# Patient Record
Sex: Female | Born: 2000 | Race: White | Hispanic: No | Marital: Single | Smoking: Never smoker
Health system: Southern US, Community
[De-identification: ages and names within clinical notes are randomized; demographics above are authoritative.]

## PROBLEM LIST (undated history)

## (undated) DIAGNOSIS — F909 Attention-deficit hyperactivity disorder, unspecified type: Secondary | ICD-10-CM

## (undated) HISTORY — PX: CHOLECYSTECTOMY: SHX55

## (undated) HISTORY — PX: TONSILLECTOMY AND ADENOIDECTOMY: SUR1326

---
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
## 2022-10-07 ENCOUNTER — Emergency Department (HOSPITAL_BASED_OUTPATIENT_CLINIC_OR_DEPARTMENT_OTHER): Payer: Medicaid Other | Admitting: Radiology

## 2022-10-07 ENCOUNTER — Emergency Department (HOSPITAL_BASED_OUTPATIENT_CLINIC_OR_DEPARTMENT_OTHER)
Admission: EM | Admit: 2022-10-07 | Discharge: 2022-10-07 | Disposition: A | Discharge status: Home / Self Care | Payer: Medicaid Other | Attending: Emergency Medicine | Admitting: Emergency Medicine

## 2022-10-07 ENCOUNTER — Other Ambulatory Visit: Payer: Self-pay

## 2022-10-07 ENCOUNTER — Encounter (HOSPITAL_BASED_OUTPATIENT_CLINIC_OR_DEPARTMENT_OTHER): Payer: Self-pay | Admitting: Emergency Medicine

## 2022-10-07 DIAGNOSIS — M79672 Pain in left foot: Secondary | ICD-10-CM | POA: Insufficient documentation
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
## 2022-11-01 ENCOUNTER — Telehealth: Payer: Self-pay | Admitting: Family Medicine

## 2022-11-01 DIAGNOSIS — R6889 Other general symptoms and signs: Secondary | ICD-10-CM

## 2022-11-01 NOTE — Progress Notes (Signed)
Connie Shepherd   Needs a extended work note from 12/15 to return to work- however we never saw her- and policy is to not back date or do extended notes.  She is encouraged to reach out to her PCP for follow up needs.

## 2023-06-03 ENCOUNTER — Telehealth: Payer: BC Managed Care – PPO | Admitting: Physician Assistant

## 2023-06-03 DIAGNOSIS — U071 COVID-19: Secondary | ICD-10-CM

## 2023-06-03 NOTE — Patient Instructions (Signed)
Connie Shepherd, thank you for joining Margaretann Loveless, PA-C for today's virtual visit.  While this provider is not your primary care provider (PCP), if your PCP is located in our provider database this encounter information will be shared with them immediately following your visit.   A [facility] MyChart account gives you access to today's visit and all your visits, tests, and labs performed at [facility] " click here if you don't have a [facility] MyChart account or go to [email or web address]  Consent: (Patient) Connie Shepherd provided verbal consent for this virtual visit at the beginning of the encounter.  Current Medications:  Current Outpatient Medications:    magic mouthwash SOLN, Take 5 mLs by mouth 3 (three) times daily., Disp: 60 mL, Rfl: 0   Medications ordered in this encounter:  No orders of the defined types were placed in this encounter.    *If you need refills on other medications prior to your next appointment, please contact your pharmacy*  Follow-Up: Call back or seek an in-person evaluation if the symptoms worsen or if the condition fails to improve as anticipated.  [facility] [phone]   Isolation Instructions: You are to isolate at home until you have been fever free for at least 24 hours without a fever-reducing medication, and symptoms have been steadily improving for 24 hours. At that time,  you can end isolation but need to mask for an additional 5 days.   If you must be around other household members who do not have symptoms, you need to make sure that both you and the family members are masking consistently with a high-quality mask.  If you note any worsening of symptoms despite treatment, please seek an in-person evaluation ASAP. If you note any significant shortness of breath or any chest pain, please seek ER evaluation. Please do not delay care!   COVID-19: What to Do if You Are Sick If you test positive and are  an older adult or someone who is at high risk of getting very sick from COVID-19, treatment may be available. Contact a healthcare provider right away after a positive test to determine if you are eligible, even if your symptoms are mild right now. You can also visit a Test to Treat location and, if eligible, receive a prescription from a provider. Don't delay: Treatment must be started within the first few days to be effective. If you have a fever, cough, or other symptoms, you might have COVID-19. Most people have mild illness and are able to recover at home. If you are sick: Keep track of your symptoms. If you have an emergency warning sign (including trouble breathing), call 911. Steps to help prevent the spread of COVID-19 if you are sick If you are sick with COVID-19 or think you might have COVID-19, follow the steps below to care for yourself and to help protect other people in your home and community. Stay home except to get medical care Stay home. Most people with COVID-19 have mild illness and can recover at home without medical care. Do not leave your home, except to get medical care. Do not visit public areas and do not go to places where you are unable to wear a mask. Take care of yourself. Get rest and stay hydrated. Take over-the-counter medicines, such as acetaminophen, to help you feel better. Stay in touch with your doctor. Call before you get medical care. Be sure to get care if you have trouble breathing, or  have any other emergency warning signs, or if you think it is an emergency. Avoid public transportation, ride-sharing, or taxis if possible. Get tested If you have symptoms of COVID-19, get tested. While waiting for test results, stay away from others, including staying apart from those living in your household. Get tested as soon as possible after your symptoms start. Treatments may be available for people with COVID-19 who are at risk for becoming very sick. Don't delay:  Treatment must be started early to be effective--some treatments must begin within 5 days of your first symptoms. Contact your healthcare provider right away if your test result is positive to determine if you are eligible. Self-tests are one of several options for testing for the virus that causes COVID-19 and may be more convenient than laboratory-based tests and point-of-care tests. Ask your healthcare provider or your local health department if you need help interpreting your test results. You can visit your state, tribal, local, and territorial health department's website to look for the latest local information on testing sites. Separate yourself from other people As much as possible, stay in a specific room and away from other people and pets in your home. If possible, you should use a separate bathroom. If you need to be around other people or animals in or outside of the home, wear a well-fitting mask. Tell your close contacts that they may have been exposed to COVID-19. An infected person can spread COVID-19 starting 48 hours (or 2 days) before the person has any symptoms or tests positive. By letting your close contacts know they may have been exposed to COVID-19, you are helping to protect everyone. See COVID-19 and Animals if you have questions about pets. If you are diagnosed with COVID-19, someone from the health department may call you. Answer the call to slow the spread. Monitor your symptoms Symptoms of COVID-19 include fever, cough, or other symptoms. Follow care instructions from your healthcare provider and local health department. Your local health authorities may give instructions on checking your symptoms and reporting information. When to seek emergency medical attention Look for emergency warning signs* for COVID-19. If someone is showing any of these signs, seek emergency medical care immediately: Trouble breathing Persistent pain or pressure in the chest New  confusion Inability to wake or stay awake Pale, gray, or blue-colored skin, lips, or nail beds, depending on skin tone *This list is not all possible symptoms. Please call your medical provider for any other symptoms that are severe or concerning to you. Call 911 or call ahead to your local emergency facility: Notify the operator that you are seeking care for someone who has or may have COVID-19. Call ahead before visiting your doctor Call ahead. Many medical visits for routine care are being postponed or done by phone or telemedicine. If you have a medical appointment that cannot be postponed, call your doctor's office, and tell them you have or may have COVID-19. This will help the office protect themselves and other patients. If you are sick, wear a well-fitting mask You should wear a mask if you must be around other people or animals, including pets (even at home). Wear a mask with the best fit, protection, and comfort for you. You don't need to wear the mask if you are alone. If you can't put on a mask (because of trouble breathing, for example), cover your coughs and sneezes in some other way. Try to stay at least 6 feet away from other people. This will help protect  the people around you. Masks should not be placed on young children under age 88 years, anyone who has trouble breathing, or anyone who is not able to remove the mask without help. Cover your coughs and sneezes Cover your mouth and nose with a tissue when you cough or sneeze. Throw away used tissues in a lined trash can. Immediately wash your hands with soap and water for at least 20 seconds. If soap and water are not available, clean your hands with an alcohol-based hand sanitizer that contains at least 60% alcohol. Clean your hands often Wash your hands often with soap and water for at least 20 seconds. This is especially important after blowing your nose, coughing, or sneezing; going to the bathroom; and before eating or  preparing food. Use hand sanitizer if soap and water are not available. Use an alcohol-based hand sanitizer with at least 60% alcohol, covering all surfaces of your hands and rubbing them together until they feel dry. Soap and water are the best option, especially if hands are visibly dirty. Avoid touching your eyes, nose, and mouth with unwashed hands. Handwashing Tips Avoid sharing personal household items Do not share dishes, drinking glasses, cups, eating utensils, towels, or bedding with other people in your home. Wash these items thoroughly after using them with soap and water or put in the dishwasher. Clean surfaces in your home regularly Clean and disinfect high-touch surfaces (for example, doorknobs, tables, handles, light switches, and countertops) in your "sick room" and bathroom. In shared spaces, you should clean and disinfect surfaces and items after each use by the person who is ill. If you are sick and cannot clean, a caregiver or other person should only clean and disinfect the area around you (such as your bedroom and bathroom) on an as needed basis. Your caregiver/other person should wait as long as possible (at least several hours) and wear a mask before entering, cleaning, and disinfecting shared spaces that you use. Clean and disinfect areas that may have blood, stool, or body fluids on them. Use household cleaners and disinfectants. Clean visible dirty surfaces with household cleaners containing soap or detergent. Then, use a household disinfectant. Use a product from [employer] List N: Disinfectants for Coronavirus (COVID-19). Be sure to follow the instructions on the label to ensure safe and effective use of the product. Many products recommend keeping the surface wet with a disinfectant for a certain period of time (look at "contact time" on the product label). You may also need to wear personal protective equipment, such as gloves, depending on the directions on the product  label. Immediately after disinfecting, wash your hands with soap and water for 20 seconds. For completed guidance on cleaning and disinfecting your home, visit Complete Disinfection Guidance. Take steps to improve ventilation at home Improve ventilation (air flow) at home to help prevent from spreading COVID-19 to other people in your household. Clear out COVID-19 virus particles in the air by opening windows, using air filters, and turning on fans in your home. Use this interactive tool to learn how to improve air flow in your home. When you can be around others after being sick with COVID-19 Deciding when you can be around others is different for different situations. Find out when you can safely end home isolation. For any additional questions about your care, contact your healthcare provider or state or local health department. 01/26/2021 Content source: [facility] for Immunization and Respiratory Diseases ([facility]), Division of Viral Diseases This information is not intended  to replace advice given to you by your health care provider. Make sure you discuss any questions you have with your health care provider. Document Revised: 03/11/2021 Document Reviewed: 03/11/2021 Elsevier Patient Education  2022 [employer].     If you have been instructed to have an in-person evaluation today at a local Urgent Care facility, please use the link below. It will take you to a list of all of our available [facility], including address, phone number and hours of operation. Please do not delay care.  [facility]  If you or a family member do not have a primary care provider, use the link below to schedule a visit and establish care. When you choose a [facility] primary care physician or advanced practice provider, you gain a long-term partner in health. Find a Primary Care Provider  Learn more about [facility]'s in-office and virtual care options: [facility] - Get  Care Now

## 2023-06-03 NOTE — Progress Notes (Signed)
Virtual Visit Consent   Connie Shepherd, you are scheduled for a virtual visit with a [facility] provider today. Just as with appointments in the office, your consent must be obtained to participate. Your consent will be active for this visit and any virtual visit you may have with one of our providers in the next 365 days. If you have a MyChart account, a copy of this consent can be sent to you electronically.  As this is a virtual visit, video technology does not allow for your provider to perform a traditional examination. This may limit your provider's ability to fully assess your condition. If your provider identifies any concerns that need to be evaluated in person or the need to arrange testing (such as labs, EKG, etc.), we will make arrangements to do so. Although advances in technology are sophisticated, we cannot ensure that it will always work on either your end or our end. If the connection with a video visit is poor, the visit may have to be switched to a telephone visit. With either a video or telephone visit, we are not always able to ensure that we have a secure connection.  By engaging in this virtual visit, you consent to the provision of healthcare and authorize for your insurance to be billed (if applicable) for the services provided during this visit. Depending on your insurance coverage, you may receive a charge related to this service.  I need to obtain your verbal consent now. Are you willing to proceed with your visit today? Connie Shepherd has provided verbal consent on 06/03/2023 for a virtual visit (video or telephone). Margaretann Loveless, PA-C  Date: 06/03/2023 1:19 PM  Virtual Visit via Video Note   I, Margaretann Loveless, connected with  Connie Shepherd  ([id number], 04/01/2001) on 06/03/23 at  1:15 PM EDT by a video-enabled telemedicine application and verified that I am speaking with the correct person using two identifiers.  Location: Patient: Virtual Visit Location  Patient: Home Provider: Virtual Visit Location Provider: Home Office   I discussed the limitations of evaluation and management by telemedicine and the availability of in person appointments. The patient expressed understanding and agreed to proceed.    History of Present Illness: Connie Shepherd is a 22 y.o. who identifies as a female who was assigned female at birth, and is being seen today for Covid 66. Tested positive on Monday, 05/29/23. Last fever was Thursday, 06/01/23. She is now symptom-free. She needs a work note stating she can return on Monday without restrictions.    Problems: There are no problems to display for this patient.   Allergies: No Known Allergies Medications:  Current Outpatient Medications:    magic mouthwash SOLN, Take 5 mLs by mouth 3 (three) times daily., Disp: 60 mL, Rfl: 0  Observations/Objective: Patient is well-developed, well-nourished in no acute distress.  Resting comfortably at home.  Head is normocephalic, atraumatic.  No labored breathing.  Speech is clear and coherent with logical content.  Patient is alert and oriented at baseline.    Assessment and Plan: 1. COVID-19  - Symptoms improved without issue - Work note to return without restrictions provided  Follow Up Instructions: I discussed the assessment and treatment plan with the patient. The patient was provided an opportunity to ask questions and all were answered. The patient agreed with the plan and demonstrated an understanding of the instructions.  A copy of instructions were sent to the patient via MyChart unless otherwise noted below.  The patient was advised to call back or seek an in-person evaluation if the symptoms worsen or if the condition fails to improve as anticipated.  Time:  I spent 10 minutes with the patient via telehealth technology discussing the above problems/concerns.    Margaretann Loveless, PA-C

## 2023-10-17 ENCOUNTER — Other Ambulatory Visit: Payer: Self-pay

## 2023-10-17 ENCOUNTER — Emergency Department (HOSPITAL_BASED_OUTPATIENT_CLINIC_OR_DEPARTMENT_OTHER): Payer: Self-pay | Admitting: Radiology

## 2023-10-17 ENCOUNTER — Encounter (HOSPITAL_BASED_OUTPATIENT_CLINIC_OR_DEPARTMENT_OTHER): Payer: Self-pay

## 2023-10-17 ENCOUNTER — Emergency Department (HOSPITAL_BASED_OUTPATIENT_CLINIC_OR_DEPARTMENT_OTHER)
Admission: EM | Admit: 2023-10-17 | Discharge: 2023-10-17 | Disposition: A | Discharge status: Home / Self Care | Payer: Self-pay | Attending: Emergency Medicine | Admitting: Emergency Medicine

## 2023-10-17 DIAGNOSIS — J181 Lobar pneumonia, unspecified organism: Secondary | ICD-10-CM | POA: Insufficient documentation

## 2023-10-17 DIAGNOSIS — J189 Pneumonia, unspecified organism: Secondary | ICD-10-CM

## 2023-10-17 DIAGNOSIS — Z1152 Encounter for screening for COVID-19: Secondary | ICD-10-CM | POA: Insufficient documentation

## 2023-10-17 LAB — RESP PANEL BY RT-PCR (RSV, FLU A&B, COVID)  RVPGX2
Influenza A by PCR: NEGATIVE
Influenza B by PCR: NEGATIVE
Resp Syncytial Virus by PCR: NEGATIVE
SARS Coronavirus 2 by RT PCR: NEGATIVE

## 2023-10-17 MED ORDER — DOXYCYCLINE HYCLATE 100 MG PO CAPS: 100.0000 mg | ORAL_CAPSULE | Freq: Two times a day (BID) | ORAL | 0 refills | Status: AC

## 2023-10-17 NOTE — Discharge Instructions (Addendum)
Thank you for allowing [place] to be a part of your care today.  Your workup is consistent with a right lower lobe pneumonia. You tested negative for Covid, influenza, and RSV.   I am sending you home on 5 days of doxycycline to treat your pneumonia.  Take for the entirety of the prescription, even if your symptoms begin to improve.  Stay sitting upright for at least 30 minutes after taking doxycycline to avoid irritation to the esophagus.   Follow up with a primary care doctor after completing your antibiotics to ensure you have improved.   You may take Delsym or Robitussin (active ingredient dextromethorphan) to help with cough symptoms, especially at night time.  Return to the ED if you develop sudden worsening of your symptoms or if you have new concerns.

## 2023-10-17 NOTE — ED Provider Notes (Signed)
[facility] EMERGENCY DEPARTMENT AT [facility] Provider Note   CSN: [id number] Arrival date & time: 10/17/23  8295     History  Chief Complaint  Patient presents with   Fever   Cough    Connie Shepherd is a 22 y.o. female with past medical history significant for ADHD presents to the ED complaining of fever and cough since last Wednesday night.  She states her fever has been waxing and waning with the highest temp being 102.32F.  She had nausea and vomiting over the weekend, but has been able to eat and drink without difficulty the past few days.  She states she mainly vomited whenever she had a high fever.  Patient reports her high fevers have also resolved and now her temp is around 100F at its highest.  She has more discomfort and coughing when she lies on her right side.  Patient works in childcare.  Denies sore throat, otalgia, congestion, shortness of breath, wheezing, abdominal pain.  Denies history of asthma or other breathing problems.        Home Medications Prior to Admission medications   Medication Sig Start Date End Date Taking? Authorizing Provider  magic mouthwash SOLN Take 5 mLs by mouth 3 (three) times daily. 09/23/20   Frederica Kuster, MD      Allergies    Patient has no known allergies.    Review of Systems   Review of Systems  Constitutional:  Positive for fever.  HENT:  Negative for congestion, ear pain and sore throat.   Respiratory:  Positive for cough. Negative for shortness of breath and wheezing.   Gastrointestinal:  Positive for nausea and vomiting. Negative for abdominal pain.    Physical Exam Updated Vital Signs BP 124/83 (BP Location: Right Arm)   Pulse (!) 108   Temp 98.2 F (36.8 C) (Oral)   Resp 18   Ht 5\' 6"  (1.676 m)   Wt 113.4 kg   LMP 10/14/2023 (Exact Date)   SpO2 99%   BMI 40.35 kg/m  Physical Exam Vitals and nursing note reviewed.  Constitutional:      General: She is not in acute distress.    Appearance: Normal  appearance. She is not ill-appearing or diaphoretic.  Cardiovascular:     Rate and Rhythm: Normal rate and regular rhythm.  Pulmonary:     Effort: Pulmonary effort is normal. No tachypnea or respiratory distress.     Breath sounds: Normal air entry. Examination of the right-lower field reveals rales. Rales present. No decreased breath sounds.  Abdominal:     General: Abdomen is flat.     Palpations: Abdomen is soft.     Tenderness: There is no abdominal tenderness.  Skin:    General: Skin is warm and dry.     Capillary Refill: Capillary refill takes less than 2 seconds.  Neurological:     Mental Status: She is alert. Mental status is at baseline.  Psychiatric:        Mood and Affect: Mood normal.        Behavior: Behavior normal.     ED Results / Procedures / Treatments   Labs (all labs ordered are listed, but only abnormal results are displayed) Labs Reviewed  RESP PANEL BY RT-PCR (RSV, FLU A&B, COVID)  [id number]    EKG None  Radiology DG Chest 2 View  Result Date: 10/17/2023 CLINICAL DATA:  Cough and fever for 6 days. EXAM: CHEST - 2 VIEW COMPARISON:  11/28/2004. FINDINGS: There  are heterogeneous opacities in the right lung lower lobe, without volume loss, compatible with pneumonia. Follow-up to clearing is recommended. Bilateral lung fields are otherwise clear. Bilateral costophrenic angles are clear. Normal cardio-mediastinal silhouette. No acute osseous abnormalities. The soft tissues are within normal limits. IMPRESSION: *Right lung lower lobe pneumonia. Follow-up to clearing is recommended. Electronically Signed   By: Jules Schick M.D.   On: 10/17/2023 10:44    Procedures Procedures    Medications Ordered in ED Medications - No data to display  ED Course/ Medical Decision Making/ A&P                                 Medical Decision Making Amount and/or Complexity of Data Reviewed Radiology: ordered.  Risk Prescription drug management.   This patient  presents to the ED with chief complaint(s) of cough, fever.  The complaint involves an extensive differential diagnosis and also carries with it a high risk of complications and morbidity.    The differential diagnosis includes viral syndrome, pneumonia   The initial plan is to obtain resp panel, chest x-ray  Initial Assessment:   On exam, patient is sitting up in bed and does not appear to be in acute respiratory distress.  She is speaking in full sentences.  Patient is mildly tachycardic with a HR of 100-106.  Regular rhythm.  Rales appreciated in the lower right lung field.  All other lung fields unremarkable.  Adequate tidal volume.  Abdomen is soft and non tender to palpation.  Bilateral TMs with serous fluid, but no evidence of infection.  EACs unremarkable.   Independent ECG/labs interpretation:  The following labs were independently interpreted:  Negative for Covid, influenza, and RSV.   Independent visualization and interpretation of imaging: I independently visualized the following imaging with scope of interpretation limited to determining acute life threatening conditions related to emergency care: chest x-ray, which revealed probable pneumonia in the right lower lobe.   Disposition:   Patient's workup consistent with pneumonia.  Will treat with antibiotics.  Advised patient to follow up with primary care provider after completion of antibiotics.  Work note provided.   The patient has been appropriately medically screened and/or stabilized in the ED. I have low suspicion for any other emergent medical condition which would require further screening, evaluation or treatment in the ED or require inpatient management. At time of discharge the patient is hemodynamically stable and in no acute distress. I have discussed work-up results and diagnosis with patient and answered all questions. Patient is agreeable with discharge plan. We discussed strict return precautions for returning to the  emergency department and they verbalized understanding.             Final Clinical Impression(s) / ED Diagnoses Final diagnoses:  Pneumonia of right lower lobe due to infectious organism    Rx / DC Orders ED Discharge Orders     None         Lenard Simmer, PA-C 10/17/23 1118    Melene Plan, DO 10/17/23 1122

## 2023-10-17 NOTE — ED Triage Notes (Signed)
Pt POV from home c/o ongoing fever w/ cough since last Wednesday night. Also c/o N/V x 2 days, but denies current N/V. Pt NAD during triage

## 2024-09-23 ENCOUNTER — Emergency Department (HOSPITAL_BASED_OUTPATIENT_CLINIC_OR_DEPARTMENT_OTHER): Payer: Self-pay | Admitting: Radiology

## 2024-09-23 ENCOUNTER — Emergency Department (HOSPITAL_BASED_OUTPATIENT_CLINIC_OR_DEPARTMENT_OTHER)
Admission: EM | Admit: 2024-09-23 | Discharge: 2024-09-23 | Disposition: A | Discharge status: Home / Self Care | Payer: Self-pay | Attending: Emergency Medicine | Admitting: Emergency Medicine

## 2024-09-23 ENCOUNTER — Other Ambulatory Visit: Payer: Self-pay

## 2024-09-23 DIAGNOSIS — J329 Chronic sinusitis, unspecified: Secondary | ICD-10-CM | POA: Insufficient documentation

## 2024-09-23 DIAGNOSIS — R112 Nausea with vomiting, unspecified: Secondary | ICD-10-CM | POA: Insufficient documentation

## 2024-09-23 DIAGNOSIS — J3489 Other specified disorders of nose and nasal sinuses: Secondary | ICD-10-CM | POA: Insufficient documentation

## 2024-09-23 LAB — URINALYSIS, ROUTINE W REFLEX MICROSCOPIC
Bacteria, UA: NONE SEEN
Bilirubin Urine: NEGATIVE
Glucose, UA: NEGATIVE mg/dL
Ketones, ur: 15 mg/dL — AB
Nitrite: NEGATIVE
Specific Gravity, Urine: 1.028 (ref 1.005–1.030)
pH: 6 (ref 5.0–8.0)

## 2024-09-23 LAB — COMPREHENSIVE METABOLIC PANEL WITH GFR
ALT: 11 U/L (ref 0–44)
AST: 15 U/L (ref 15–41)
Albumin: 4.8 g/dL (ref 3.5–5.0)
Alkaline Phosphatase: 110 U/L (ref 38–126)
Anion gap: 13 (ref 5–15)
BUN: 8 mg/dL (ref 6–20)
CO2: 25 mmol/L (ref 22–32)
Calcium: 10.1 mg/dL (ref 8.9–10.3)
Chloride: 105 mmol/L (ref 98–111)
Creatinine, Ser: 0.76 mg/dL (ref 0.44–1.00)
GFR, Estimated: >60 mL/min (ref >60)
Glucose, Bld: 112 mg/dL — ABNORMAL HIGH (ref 70–99)
Potassium: 3.8 mmol/L (ref 3.5–5.1)
Sodium: 143 mmol/L (ref 135–145)
Total Bilirubin: 0.4 mg/dL (ref 0.0–1.2)
Total Protein: 8 g/dL (ref 6.5–8.1)

## 2024-09-23 LAB — RESP PANEL BY RT-PCR (RSV, FLU A&B, COVID)  RVPGX2
Influenza A by PCR: NEGATIVE
Influenza B by PCR: NEGATIVE
Resp Syncytial Virus by PCR: NEGATIVE
SARS Coronavirus 2 by RT PCR: NEGATIVE

## 2024-09-23 LAB — PREGNANCY, URINE: Preg Test, Ur: NEGATIVE

## 2024-09-23 LAB — CBC WITH DIFFERENTIAL/PLATELET
Abs Immature Granulocytes: 0.09 K/uL — ABNORMAL HIGH (ref 0.00–0.07)
Basophils Absolute: 0.1 K/uL (ref 0.0–0.1)
Basophils Relative: 0 %
Eosinophils Absolute: 0.1 K/uL (ref 0.0–0.5)
Eosinophils Relative: 1 %
HCT: 41 % (ref 36.0–46.0)
Hemoglobin: 13.6 g/dL (ref 12.0–15.0)
Immature Granulocytes: 1 %
Lymphocytes Relative: 13 %
Lymphs Abs: 2.2 K/uL (ref 0.7–4.0)
MCH: 28.9 pg (ref 26.0–34.0)
MCHC: 33.2 g/dL (ref 30.0–36.0)
MCV: 87.2 fL (ref 80.0–100.0)
Monocytes Absolute: 0.8 K/uL (ref 0.1–1.0)
Monocytes Relative: 5 %
Neutro Abs: 13.7 K/uL — ABNORMAL HIGH (ref 1.7–7.7)
Neutrophils Relative %: 80 %
Platelets: 411 K/uL — ABNORMAL HIGH (ref 150–400)
RBC: 4.7 MIL/uL (ref 3.87–5.11)
RDW: 13.2 % (ref 11.5–15.5)
WBC: 16.9 K/uL — ABNORMAL HIGH (ref 4.0–10.5)
nRBC: 0 % (ref 0.0–0.2)

## 2024-09-23 LAB — GROUP A STREP BY PCR: Group A Strep by PCR: NOT DETECTED

## 2024-09-23 MED ORDER — ONDANSETRON HCL 4 MG/2ML IJ SOLN
4.0000 mg | Freq: Once | INTRAMUSCULAR | Status: AC
  Administered 2024-09-23: 4 mg via INTRAVENOUS
  Filled 2024-09-23: qty 2

## 2024-09-23 MED ORDER — ONDANSETRON 4 MG PO TBDP: 4.0000 mg | ORAL_TABLET | Freq: Three times a day (TID) | ORAL | 0 refills | Status: DC | PRN

## 2024-09-23 MED ORDER — AMOXICILLIN-POT CLAVULANATE 875-125 MG PO TABS: 1.0000 | ORAL_TABLET | Freq: Two times a day (BID) | ORAL | 0 refills | Status: DC

## 2024-09-23 MED ORDER — AMOXICILLIN-POT CLAVULANATE 875-125 MG PO TABS
1.0000 | ORAL_TABLET | Freq: Once | ORAL | Status: AC
  Administered 2024-09-23: 1 via ORAL
  Filled 2024-09-23: qty 1

## 2024-09-23 NOTE — Discharge Instructions (Addendum)
 Today you were seen for a upper respiratory infection and nausea and vomiting.  You have been prescribed Augmentin for what I suspect is likely bacterial sinusitis.  You have also been prescribed Zofran  for nausea and vomiting.  You may alternate Tylenol  Motrin as needed for pain.  Thank you for letting us  treat you today. After reviewing your labs and imaging, I feel you are safe to go home. Please follow up with your PCP in the next several days and provide them with your records from this visit. Return to the Emergency Room if pain becomes severe or symptoms worsen.

## 2024-09-23 NOTE — ED Notes (Signed)
 Pt states provider told her to eat crackers with her antibiotic. Pt asking for crackers. Pt given crackers

## 2024-09-23 NOTE — ED Triage Notes (Signed)
 Reports n/v starting last night. States had cold like symptoms x 2 weeks. Denies fever.

## 2024-09-23 NOTE — ED Notes (Signed)
Given ginger ail for PO challenge

## 2024-09-23 NOTE — ED Provider Notes (Signed)
 [facility] Provider Note   CSN: [id number] Arrival date & time: 09/23/24  1559     Patient presents with: Emesis   Connie Shepherd is a 23 y.o. female.  Presents today for cough, congestion, and runny nose times approximately 2 weeks.  Patient also reports nausea and vomiting that began last night.  Patient denies chest pain, shortness of breath, fever, chills, or abdominal pain.    Emesis Associated symptoms: cough        Prior to Admission medications   Medication Sig Start Date End Date Taking? Authorizing Provider  amoxicillin-clavulanate (AUGMENTIN) 875-125 MG tablet Take 1 tablet by mouth every 12 (twelve) hours. 09/23/24  Yes Francis Ileana SAILOR, PA-C  ondansetron  (ZOFRAN -ODT) 4 MG disintegrating tablet Take 1 tablet (4 mg total) by mouth every 8 (eight) hours as needed for nausea or vomiting. 09/23/24  Yes Francis Ileana SAILOR, PA-C  magic mouthwash SOLN Take 5 mLs by mouth 3 (three) times daily. 09/23/20   Cleotilde Garnette HERO, MD    Allergies: Patient has no known allergies.    Review of Systems  HENT:  Positive for congestion and rhinorrhea.   Respiratory:  Positive for cough.   Gastrointestinal:  Positive for nausea and vomiting.    Updated Vital Signs BP 130/60   Pulse 85   Temp 98.8 F (37.1 C)   Resp 15   SpO2 100%   Physical Exam Vitals and nursing note reviewed.  Constitutional:      General: She is not in acute distress.    Appearance: She is well-developed. She is not toxic-appearing or diaphoretic.  HENT:     Head: Normocephalic and atraumatic.     Right Ear: External ear normal.     Left Ear: External ear normal.     Nose: Congestion and rhinorrhea present.     Mouth/Throat:     Mouth: Mucous membranes are moist.     Pharynx: Oropharynx is clear.  Eyes:     Extraocular Movements: Extraocular movements intact.     Conjunctiva/sclera: Conjunctivae normal.  Cardiovascular:     Rate and Rhythm: Normal rate and  regular rhythm.     Pulses: Normal pulses.     Heart sounds: Normal heart sounds. No murmur heard. Pulmonary:     Effort: Pulmonary effort is normal. No respiratory distress.     Breath sounds: Normal breath sounds.  Abdominal:     General: There is no distension.     Palpations: Abdomen is soft.     Tenderness: There is no abdominal tenderness. There is no guarding.  Musculoskeletal:        General: No swelling.     Cervical back: Normal range of motion and neck supple.  Skin:    General: Skin is warm and dry.     Capillary Refill: Capillary refill takes less than 2 seconds.  Neurological:     General: No focal deficit present.     Mental Status: She is alert and oriented to person, place, and time.  Psychiatric:        Mood and Affect: Mood normal.     (all labs ordered are listed, but only abnormal results are displayed) Labs Reviewed  COMPREHENSIVE METABOLIC PANEL WITH GFR - Abnormal; Notable for the following components:      Result Value   Glucose, Bld 112 (*)    All other components within normal limits  CBC WITH DIFFERENTIAL/PLATELET - Abnormal; Notable for the following components:  WBC 16.9 (*)    Platelets 411 (*)    Neutro Abs 13.7 (*)    Abs Immature Granulocytes 0.09 (*)    All other components within normal limits  URINALYSIS, ROUTINE W REFLEX MICROSCOPIC - Abnormal; Notable for the following components:   Hgb urine dipstick MODERATE (*)    Ketones, ur 15 (*)    Protein, ur TRACE (*)    Leukocytes,Ua TRACE (*)    All other components within normal limits  GROUP A STREP BY PCR  RESP PANEL BY RT-PCR (RSV, FLU A&B, COVID)  RVPGX2  PREGNANCY, URINE    EKG: None  Radiology: DG Chest 2 View Result Date: 09/23/2024 CLINICAL DATA:  Cough, nausea, vomiting EXAM: CHEST - 2 VIEW COMPARISON:  Chest radiograph dated 10/17/2023 FINDINGS: Normal lung volumes. No focal consolidations. No pleural effusion or pneumothorax. The heart size and mediastinal contours are  within normal limits. No acute osseous abnormality. IMPRESSION: No active cardiopulmonary disease. Electronically Signed   By: Limin  Xu M.D.   On: 09/23/2024 17:48     Procedures   Medications Ordered in the ED  amoxicillin-clavulanate (AUGMENTIN) 875-125 MG per tablet 1 tablet (has no administration in time range)  ondansetron  (ZOFRAN ) injection 4 mg (4 mg Intravenous Given 09/23/24 1710)  ondansetron  (ZOFRAN ) injection 4 mg (4 mg Intravenous Given 09/23/24 2042)                                    Medical Decision Making Amount and/or Complexity of Data Reviewed Labs: ordered. Radiology: ordered.  Risk Prescription drug management.   This patient presents to the ED for concern of N/V, URI sx differential diagnosis includes COVID, flu, RSV, viral upper respiratory infection, pneumonia, choledocholithiasis, acute cholecystitis, appendicitis, viral GI illness, SBO, volvulus,    Additional history obtained   Additional history obtained from Electronic Medical Record External records from outside source obtained and reviewed including family medicine notes   Lab Tests:  I Ordered, and personally interpreted labs.  The pertinent results include: CMP unremarkable, leukocytosis at 16.9, elevated platelets at 411, UA with moderate hemoglobin, 15 ketones, trace leukocytes, trace protein, negative strep PCR, Resp panel negative   Imaging Studies ordered:  I ordered imaging studies including chest x-ray I independently visualized and interpreted imaging which showed no active cardiopulmonary disease I agree with the radiologist interpretation   Medicines ordered and prescription drug management:  I ordered medication including Zofran  and Augmentin I have reviewed the patients home medicines and have made adjustments as needed   Problem List / ED Course:  Patient tolerating p.o. intake without issue prior to discharge. Considered for admission further workup however  patient's vital signs, physical exam, labs, and imaging are reassuring.  Patient's symptoms likely due to acute bacterial sinusitis.  Patient given course of Augmentin and Zofran  for nausea and vomiting.  Patient given return precautions.  I feel patient is safe for discharge at this time.       Final diagnoses:  Sinusitis, unspecified chronicity, unspecified location  Nausea and vomiting, unspecified vomiting type    ED Discharge Orders          Ordered    ondansetron  (ZOFRAN -ODT) 4 MG disintegrating tablet  Every 8 hours PRN        09/23/24 2053    amoxicillin-clavulanate (AUGMENTIN) 875-125 MG tablet  Every 12 hours        09/23/24 2053  Francis Ileana SAILOR, PA-C 09/23/24 2101    Bernard Drivers, MD 09/23/24 2302

## 2024-10-26 ENCOUNTER — Encounter (HOSPITAL_COMMUNITY): Payer: Self-pay

## 2024-10-26 ENCOUNTER — Ambulatory Visit (HOSPITAL_COMMUNITY): Payer: Self-pay

## 2024-10-26 ENCOUNTER — Ambulatory Visit (HOSPITAL_COMMUNITY)
Admission: RE | Admit: 2024-10-26 | Discharge: 2024-10-26 | Disposition: A | Discharge status: Home / Self Care | Payer: Self-pay | Source: Ambulatory Visit | Attending: Emergency Medicine | Admitting: Emergency Medicine

## 2024-10-26 VITALS — BP 110/70 | HR 100 | Temp 97.9°F | Resp 17

## 2024-10-26 DIAGNOSIS — R051 Acute cough: Secondary | ICD-10-CM

## 2024-10-26 DIAGNOSIS — J209 Acute bronchitis, unspecified: Secondary | ICD-10-CM

## 2024-10-26 MED ORDER — ALBUTEROL SULFATE HFA 108 (90 BASE) MCG/ACT IN AERS: 1.0000 | INHALATION_SPRAY | Freq: Four times a day (QID) | RESPIRATORY_TRACT | 0 refills | Status: AC | PRN

## 2024-10-26 MED ORDER — PREDNISONE 20 MG PO TABS: 40.0000 mg | ORAL_TABLET | Freq: Every day | ORAL | 0 refills | Status: AC

## 2024-10-26 MED ORDER — PROMETHAZINE-DM 6.25-15 MG/5ML PO SYRP: 5.0000 mL | ORAL_SOLUTION | Freq: Every evening | ORAL | 0 refills | Status: AC | PRN

## 2024-10-26 MED ORDER — BENZONATATE 100 MG PO CAPS: 100.0000 mg | ORAL_CAPSULE | Freq: Three times a day (TID) | ORAL | 0 refills | Status: AC

## 2024-10-26 NOTE — Discharge Instructions (Signed)
 Your x-ray did not reveal any underlying pneumonia. I believe your symptoms are likely related to an ongoing bronchitis. Start taking 2 tablets of prednisone  once daily for 5 days to help with this. You can take Tessalon  every 8 hours as needed for cough. You can take Promethazine  DM cough syrup at bedtime as needed for cough.  This can make you drowsy so do not drive, work, drink alcohol, or take this with NyQuil or other sedating medications. I have also prescribed an albuterol  inhaler that you can use 1 to 2 puffs every 4-6 hours as needed for any shortness of breath or chest tightness.  This can also help with cough. Follow-up with your primary care provider or return here as needed.

## 2024-10-26 NOTE — ED Triage Notes (Signed)
 Pt reports she had cough since 12/5. Reports since last week had congestion and coughing up phlegm. Mucous is now greenish when blows her nose. Pt taking Dayquil and Nyquil. Reports when takes a deep breath it feels cold

## 2024-10-26 NOTE — ED Provider Notes (Signed)
 " [facility]    CSN: [id number] Arrival date & time: 10/26/24  9182      History   Chief Complaint Chief Complaint  Patient presents with   Cough    With back pain and congestion - Entered by patient    HPI Connie Shepherd is a 23 y.o. female.   Patient presents with progressively worsening cough since 12/5.  Patient states that she feels like she has had an increase in nasal congestion, chest congestion, and coughing up phlegm over the last week.  Patient states that at times she does have chest pain/tightness and shortness of breath with coughing.  Patient states that she has also felt a little nauseated over the last week or so.  Patient reports that her cough has been keeping her up at night.  Patient reports that she has been taking DayQuil and NyQuil with minimal relief.  Denies any history of asthma.  Denies any fever, body aches, chills, and weakness.  The history is provided by the patient and medical records.  Cough   Past Medical History:  Diagnosis Date   ADHD (attention deficit hyperactivity disorder)     There are no active problems to display for this patient.   Past Surgical History:  Procedure Laterality Date   CHOLECYSTECTOMY     TONSILLECTOMY AND ADENOIDECTOMY      OB History   No obstetric history on file.      Home Medications    Prior to Admission medications  Medication Sig Start Date End Date Taking? Authorizing Provider  albuterol  (VENTOLIN  HFA) 108 (90 Base) MCG/ACT inhaler Inhale 1-2 puffs into the lungs every 6 (six) hours as needed for wheezing or shortness of breath. 10/26/24  Yes Johnie, Quierra Silverio A, NP  benzonatate  (TESSALON ) 100 MG capsule Take 1 capsule (100 mg total) by mouth every 8 (eight) hours. 10/26/24  Yes Johnie, Moise Friday A, NP  predniSONE  (DELTASONE ) 20 MG tablet Take 2 tablets (40 mg total) by mouth daily for 5 days. 10/26/24 10/31/24 Yes Johnie Flaming A, NP  promethazine -dextromethorphan (PROMETHAZINE -DM)  6.25-15 MG/5ML syrup Take 5 mLs by mouth at bedtime as needed for cough. 10/26/24  Yes Johnie Flaming A, NP  magic mouthwash SOLN Take 5 mLs by mouth 3 (three) times daily. 09/23/20   Cleotilde Garnette HERO, MD    Family History Family History  Problem Relation Age of Onset   Healthy Mother     Social History Social History[1]   Allergies   Patient has no known allergies.   Review of Systems Review of Systems  Respiratory:  Positive for cough.    Per HPI  Physical Exam Triage Vital Signs ED Triage Vitals [10/26/24 0841]  Encounter Vitals Group     BP 110/70     Girls Systolic BP Percentile      Girls Diastolic BP Percentile      Boys Systolic BP Percentile      Boys Diastolic BP Percentile      Pulse Rate 100     Resp 17     Temp 97.9 F (36.6 C)     Temp Source Oral     SpO2 97 %     Weight      Height      Head Circumference      Peak Flow      Pain Score 4     Pain Loc      Pain Education      Exclude from Growth Chart  No data found.  Updated Vital Signs BP 110/70 (BP Location: Left Arm)   Pulse 100   Temp 97.9 F (36.6 C) (Oral)   Resp 17   LMP 10/16/2024 (Exact Date)   SpO2 97%   Visual Acuity Right Eye Distance:   Left Eye Distance:   Bilateral Distance:    Right Eye Near:   Left Eye Near:    Bilateral Near:     Physical Exam Vitals and nursing note reviewed.  Constitutional:      General: She is awake. She is not in acute distress.    Appearance: Normal appearance. She is well-developed and well-groomed. She is not ill-appearing.  HENT:     Nose: Congestion and rhinorrhea present.     Mouth/Throat:     Mouth: Mucous membranes are moist.     Pharynx: Posterior oropharyngeal erythema and postnasal drip present. No oropharyngeal exudate.  Cardiovascular:     Rate and Rhythm: Normal rate and regular rhythm.  Pulmonary:     Effort: Pulmonary effort is normal.     Breath sounds: Normal breath sounds.  Skin:    General: Skin is warm  and dry.  Neurological:     General: No focal deficit present.     Mental Status: She is alert and oriented to person, place, and time. Mental status is at baseline.  Psychiatric:        Behavior: Behavior is cooperative.      UC Treatments / Results  Labs (all labs ordered are listed, but only abnormal results are displayed) Labs Reviewed - No data to display  EKG   Radiology DG Chest 2 View Result Date: 10/26/2024 EXAM: 2 VIEW(S) XRAY OF THE CHEST 10/26/2024 08:58:56 AM COMPARISON: 09/23/2024 CLINICAL HISTORY: cough since 12/5, chest pain/tightness and shortness of breath with cough FINDINGS: LUNGS AND PLEURA: No focal pulmonary opacity. No pleural effusion. No pneumothorax. HEART AND MEDIASTINUM: No acute abnormality of the cardiac and mediastinal silhouettes. BONES AND SOFT TISSUES: No acute osseous abnormality. IMPRESSION: 1. No acute cardiopulmonary abnormality. Electronically signed by: Waddell Calk MD 10/26/2024 09:15 AM EST RP Workstation: [id number]    Procedures Procedures (including critical care time)  Medications Ordered in UC Medications - No data to display  Initial Impression / Assessment and Plan / UC Course  I have reviewed the triage vital signs and the nursing notes.  Pertinent labs & imaging results that were available during my care of the patient were reviewed by me and considered in my medical decision making (see chart for details).     Patient is overall well-appearing.  Vitals are stable.  Heart and lung sounds normal.  Chest x-ray ordered due to ongoing symptoms.  I independently interpreted these images and there is no acute cardiopulmonary disease.  Radiology report confirms this.  Symptoms likely related to an ongoing bronchitis.  Prescribed short prednisone  burst for this.  Prescribed Tessalon  and Promethazine  DM as needed for cough.  Prescribed albuterol  inhaler as needed for any shortness of breath or chest tightness.  Discussed follow-up and  return precautions. Final Clinical Impressions(s) / UC Diagnoses   Final diagnoses:  Acute cough  Acute bronchitis, unspecified organism     Discharge Instructions      Your x-ray did not reveal any underlying pneumonia. I believe your symptoms are likely related to an ongoing bronchitis. Start taking 2 tablets of prednisone  once daily for 5 days to help with this. You can take Tessalon  every 8 hours as needed for cough. You  can take Promethazine  DM cough syrup at bedtime as needed for cough.  This can make you drowsy so do not drive, work, drink alcohol, or take this with NyQuil or other sedating medications. I have also prescribed an albuterol  inhaler that you can use 1 to 2 puffs every 4-6 hours as needed for any shortness of breath or chest tightness.  This can also help with cough. Follow-up with your primary care provider or return here as needed.     ED Prescriptions     Medication Sig Dispense Auth. Provider   albuterol  (VENTOLIN  HFA) 108 (90 Base) MCG/ACT inhaler Inhale 1-2 puffs into the lungs every 6 (six) hours as needed for wheezing or shortness of breath. 18 g Johnie Flaming A, NP   predniSONE  (DELTASONE ) 20 MG tablet Take 2 tablets (40 mg total) by mouth daily for 5 days. 10 tablet Johnie Flaming A, NP   promethazine -dextromethorphan (PROMETHAZINE -DM) 6.25-15 MG/5ML syrup Take 5 mLs by mouth at bedtime as needed for cough. 118 mL Johnie Flaming A, NP   benzonatate  (TESSALON ) 100 MG capsule Take 1 capsule (100 mg total) by mouth every 8 (eight) hours. 21 capsule Johnie Flaming A, NP      PDMP not reviewed this encounter.     [1]  Social History Tobacco Use   Smoking status: Never   Smokeless tobacco: Never     Johnie Flaming LABOR, NP 10/26/24 1034  "
